# Patient Record
Sex: Female | Born: 1970 | Race: White | Hispanic: No | Smoking: Never smoker
Health system: Southern US, Community
[De-identification: ages and names within clinical notes are randomized; demographics above are authoritative.]

## PROBLEM LIST (undated history)

## (undated) DIAGNOSIS — E288 Other ovarian dysfunction: Secondary | ICD-10-CM

## (undated) DIAGNOSIS — R87619 Unspecified abnormal cytological findings in specimens from cervix uteri: Secondary | ICD-10-CM

## (undated) DIAGNOSIS — F419 Anxiety disorder, unspecified: Secondary | ICD-10-CM

## (undated) DIAGNOSIS — E2839 Other primary ovarian failure: Secondary | ICD-10-CM

## (undated) DIAGNOSIS — B009 Herpesviral infection, unspecified: Secondary | ICD-10-CM

## (undated) DIAGNOSIS — M8580 Other specified disorders of bone density and structure, unspecified site: Secondary | ICD-10-CM

## (undated) HISTORY — DX: Other ovarian dysfunction: E28.8

## (undated) HISTORY — DX: Other primary ovarian failure: E28.39

## (undated) HISTORY — DX: Other specified disorders of bone density and structure, unspecified site: M85.80

## (undated) HISTORY — DX: Anxiety disorder, unspecified: F41.9

## (undated) HISTORY — DX: Unspecified abnormal cytological findings in specimens from cervix uteri: R87.619

## (undated) HISTORY — DX: Herpesviral infection, unspecified: B00.9

---

## 1991-06-04 HISTORY — PX: WISDOM TOOTH EXTRACTION: SHX21

## 1993-06-03 DIAGNOSIS — R87619 Unspecified abnormal cytological findings in specimens from cervix uteri: Secondary | ICD-10-CM

## 1993-06-03 HISTORY — DX: Unspecified abnormal cytological findings in specimens from cervix uteri: R87.619

## 1994-03-03 HISTORY — PX: GYNECOLOGIC CRYOSURGERY: SHX857

## 1998-09-21 ENCOUNTER — Other Ambulatory Visit: Admission: RE | Admit: 1998-09-21 | Discharge: 1998-09-21 | Payer: Self-pay | Admitting: Obstetrics and Gynecology

## 1999-01-24 ENCOUNTER — Other Ambulatory Visit: Admission: RE | Admit: 1999-01-24 | Discharge: 1999-01-24 | Payer: Self-pay | Admitting: Obstetrics and Gynecology

## 1999-10-03 ENCOUNTER — Other Ambulatory Visit: Admission: RE | Admit: 1999-10-03 | Discharge: 1999-10-03 | Payer: Self-pay | Admitting: Obstetrics and Gynecology

## 2000-10-02 ENCOUNTER — Other Ambulatory Visit: Admission: RE | Admit: 2000-10-02 | Discharge: 2000-10-02 | Payer: Self-pay | Admitting: Obstetrics and Gynecology

## 2001-10-09 ENCOUNTER — Other Ambulatory Visit: Admission: RE | Admit: 2001-10-09 | Discharge: 2001-10-09 | Payer: Self-pay | Admitting: Obstetrics and Gynecology

## 2002-11-12 ENCOUNTER — Other Ambulatory Visit: Admission: RE | Admit: 2002-11-12 | Discharge: 2002-11-12 | Payer: Self-pay | Admitting: Obstetrics and Gynecology

## 2004-01-06 ENCOUNTER — Other Ambulatory Visit: Admission: RE | Admit: 2004-01-06 | Discharge: 2004-01-06 | Payer: Self-pay | Admitting: Obstetrics and Gynecology

## 2005-02-14 ENCOUNTER — Other Ambulatory Visit: Admission: RE | Admit: 2005-02-14 | Discharge: 2005-02-14 | Payer: Self-pay | Admitting: Obstetrics and Gynecology

## 2006-02-28 ENCOUNTER — Other Ambulatory Visit: Admission: RE | Admit: 2006-02-28 | Discharge: 2006-02-28 | Payer: Self-pay | Admitting: Obstetrics & Gynecology

## 2007-03-03 ENCOUNTER — Other Ambulatory Visit: Admission: RE | Admit: 2007-03-03 | Discharge: 2007-03-03 | Payer: Self-pay | Admitting: Obstetrics and Gynecology

## 2008-02-26 ENCOUNTER — Other Ambulatory Visit: Admission: RE | Admit: 2008-02-26 | Discharge: 2008-02-26 | Payer: Self-pay | Admitting: Obstetrics and Gynecology

## 2010-04-16 LAB — HM PAP SMEAR: HM PAP: NEGATIVE

## 2010-05-21 ENCOUNTER — Encounter
Admission: RE | Admit: 2010-05-21 | Discharge: 2010-05-21 | Payer: Self-pay | Source: Home / Self Care | Attending: Obstetrics & Gynecology | Admitting: Obstetrics & Gynecology

## 2015-06-26 ENCOUNTER — Encounter: Payer: Self-pay | Admitting: Nurse Practitioner

## 2015-06-26 ENCOUNTER — Ambulatory Visit (INDEPENDENT_AMBULATORY_CARE_PROVIDER_SITE_OTHER): Payer: BLUE CROSS/BLUE SHIELD | Admitting: Nurse Practitioner

## 2015-06-26 ENCOUNTER — Encounter: Payer: Self-pay | Admitting: *Deleted

## 2015-06-26 VITALS — BP 136/80 | HR 84 | Ht 59.5 in | Wt 99.0 lb

## 2015-06-26 DIAGNOSIS — Z01419 Encounter for gynecological examination (general) (routine) without abnormal findings: Secondary | ICD-10-CM | POA: Diagnosis not present

## 2015-06-26 DIAGNOSIS — E2839 Other primary ovarian failure: Secondary | ICD-10-CM

## 2015-06-26 DIAGNOSIS — M8580 Other specified disorders of bone density and structure, unspecified site: Secondary | ICD-10-CM | POA: Diagnosis not present

## 2015-06-26 DIAGNOSIS — Z0000 Encounter for general adult medical examination without abnormal findings: Secondary | ICD-10-CM | POA: Diagnosis not present

## 2015-06-26 DIAGNOSIS — E288 Other ovarian dysfunction: Secondary | ICD-10-CM

## 2015-06-26 NOTE — Patient Instructions (Addendum)

## 2015-06-26 NOTE — Progress Notes (Signed)
Patient ID: Andrea Newman, female   DOB: 1971-05-26, 45 y.o.   MRN: [id number]  45 y.o. G0P0 Significant other Caucasian Fe here for NGYN annual exam.  She is a former pt with lapse of care secondary to insurance.  She has history of POF at age 89- 32.  Last FSH was 150  on 04/16/2010.  She was place on Loestrin and did well until going off late 2013.  Same partner monogamous for about 17 yrs.  She is having no vaso symptoms.  She has had an increase in anxiety since her mothers death in March 15, 2023.  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  She is now back out of school to care for her mother and has not been able to go back to school since then.   She was pursuing a business degree.  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  She declines any labs today.  Patient's last menstrual period was 04/15/2011 (exact date).          Sexually active: Yes.    The current method of family planning is premature ovarian failure.    Exercising: Yes.    ocassional walking Smoker:  no  Health Maintenance: Pap:  04/16/10, Negative  Prior abnormal pap CIN I 1995 MMG:  Never BMD:  05/21/2010: T score spine-1.6; left femur neck -1.8 TDaP:  2011 HIV:  Not done  Labs: not done   reports that she has never smoked. She has never used smokeless tobacco. She reports that she drinks alcohol. She reports that she does not use illicit drugs.  Past Medical History  Diagnosis Date  . 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 HSV-1 infection   . Premature ovarian failure   . Osteopenia   . Anxiety     Past Surgical History  Procedure Laterality Date  . Wisdom tooth extraction  1993  . Gynecologic cryosurgery  03/1994    CIN-I    Current Outpatient Prescriptions  Medication Sig Dispense Refill  . CALCIUM PO Take 120 mg by mouth daily.    . Multiple Vitamin  (MULTIVITAMIN) tablet Take 1 tablet by mouth daily.    . vitamin C (ASCORBIC ACID) 500 MG tablet Take 500 mg by mouth daily.     No current facility-administered medications for this visit.    Family History  Problem Relation Age of Onset  . Ovarian cancer Maternal Grandmother   . Cancer Mother     skin  . Heart failure Mother   . Hypertension Mother   . Diabetes Maternal Grandfather   . Stroke Maternal Grandfather     ROS:  Pertinent items are noted in HPI.  Otherwise, a comprehensive ROS was negative.  Exam:   BP 136/80 mmHg  Pulse 84  Ht 4' 11.5" (1.511 m)  Wt 99 lb (44.906 kg)  BMI 19.67 kg/m2  LMP 04/15/2011 (Exact Date) Height: 4' 11.5" (151.1 cm) Ht Readings from Last 3 Encounters:  06/26/15 4' 11.5" (1.511 m)    General appearance: alert, cooperative and appears stated age Head: Normocephalic, without obvious abnormality, atraumatic Neck: no adenopathy, supple, symmetrical, trachea midline and thyroid normal to inspection and palpation Lungs: clear to auscultation bilaterally Breasts: normal appearance, no masses or tenderness Heart: regular rate and rhythm Abdomen: soft, non-tender;  no masses,  no organomegaly Extremities: extremities normal, atraumatic, no cyanosis or edema Skin: Skin color, texture, turgor normal. No rashes or lesions Lymph nodes: Cervical, supraclavicular, and axillary nodes normal. No abnormal inguinal nodes palpated Neurologic: Grossly normal   Pelvic: External genitalia:  no lesions              Urethra:  normal appearing urethra with no masses, tenderness or lesions              Bartholin's and Skene's: normal                 Vagina: normal appearing vagina with normal color and discharge, no lesions              Cervix: anteverted              Pap taken: Yes.   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 Hyacinth Meeker as to plan for OCP/ HRT ?  Counseled on breast self exam, mammography screening, adequate intake of calcium and vitamin D, diet and exercise return annually or prn  An After Visit Summary was printed and given to the patient.

## 2015-06-28 LAB — IPS PAP TEST WITH HPV

## 2015-06-30 NOTE — Progress Notes (Signed)
Feel pt should start HRT at this time.  Would initially recommend Vivelle dot 0.5mg  patches twice weekly and Prometrium  daily.  Reviewed personally.  Lum Keas, MD.

## 2015-07-07 ENCOUNTER — Telehealth: Payer: Self-pay

## 2015-07-07 NOTE — Telephone Encounter (Signed)
Spoke with patient. Advised of recommendations from Dr.Miller as seen below. She is requesting to be seen for a consultation appointment with Dr.Miller prior to stating on HRT. Appointment scheduled for 07/14/2015 at 11:15 am with Dr.Miller. She is agreeable to date and time.  Cc: Ria Comment, FNP   Routing to provider for final review. Patient agreeable to disposition. Will close encounter.

## 2015-07-07 NOTE — Telephone Encounter (Addendum)
-----   Message from Ria Comment, FNP sent at 07/02/2015  8:24 PM EST ----- Patient is aware of consultation with Dr. Hyacinth Meeker.  Here are her recommendations. ----- Message -----    From: Jerene Bears, MD    Sent: 06/30/2015   5:16 PM      To: Ria Comment, FNP  Jerene Bears, MD at 06/30/2015 5:15 PM     Status: Signed       Expand All Collapse All   Feel pt should start HRT at this time. Would initially recommend Vivelle dot 0.5mg  patches twice weekly and Prometrium  daily. Reviewed personally. Lum Keas, MD.

## 2015-07-14 ENCOUNTER — Encounter: Payer: Self-pay | Admitting: Obstetrics & Gynecology

## 2015-07-14 ENCOUNTER — Ambulatory Visit (INDEPENDENT_AMBULATORY_CARE_PROVIDER_SITE_OTHER): Payer: BLUE CROSS/BLUE SHIELD | Admitting: Obstetrics & Gynecology

## 2015-07-14 VITALS — BP 158/86 | HR 84 | Resp 16 | Ht 59.5 in | Wt 101.0 lb

## 2015-07-14 DIAGNOSIS — E2839 Other primary ovarian failure: Secondary | ICD-10-CM

## 2015-07-14 DIAGNOSIS — Z1239 Encounter for other screening for malignant neoplasm of breast: Secondary | ICD-10-CM | POA: Diagnosis not present

## 2015-07-14 DIAGNOSIS — E288 Other ovarian dysfunction: Secondary | ICD-10-CM | POA: Diagnosis not present

## 2015-07-14 DIAGNOSIS — M8580 Other specified disorders of bone density and structure, unspecified site: Secondary | ICD-10-CM

## 2015-07-14 NOTE — Progress Notes (Signed)
Scheduled patient while [facility] on 08/04/2015 at 3:30 pm. She is agreeable to date and time.

## 2015-07-14 NOTE — Progress Notes (Signed)
Patient ID: Andrea Newman, female   DOB: 1971/02/06, 45 y.o.   MRN: [id number]  45 yo G0 WF here for discussion of HRT use.  Pt with hx of POF around age 36.  She was on Loestrin OCPs until 2013.  Pt reports she had job change and was without insurance and it was just too costly for her to pay for the pill.  She was seen 06/26/15 for AEX with [facility].  This was discussed briefly but pt is here for a more in-depth discussion and to answer questions she may have regarding this.  Due to her young age and body habitus ( 66ft 11.5 in, wt 99#) as well as hx  of osteopenia noted on BMD 05/21/10 with T score -1.6 in spine and -1.8 in femur, I feel she should consider HRT for long term bone health and cardiovascular benefit.  Pt is anxious about risks.  These were discussed including but not limited to increased risks of increased risks of MI primarily within first two years of use, stroke, DVT, and breast cancer.  Increased risks of gall bladder disease and change in cholesterol panels also discussed.  Possibility of bleeding discussed however this is doubtful of this with hx of amenorrhea with her OCPs.  Pt is not symptomatic of menopause now with basically no hot flashes or sleep issues.  She does have some mild vaginal dryness.  Pt is aware this will improve.  Also, pt is aware that the typical woman, her age, would have endogenous estrogen so her risks of negative side effects would be less than a woman who is above the age of 68.  We also reviewed benefits including improved bone density, improved CV health long term and decreased risks of colon cancer.     Past Medical History  Diagnosis Date  . 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 HSV-1 infection   . Premature ovarian failure   . Osteopenia   . Anxiety    Heart disease:  No. DVT history:  No. Breast Cancer:  No.  Last Mammogram: never had one.  Pt aware she needs to start this. Last AEX:   06/26/15 Last Pap smear:  06/26/15  The pt does not have a strong family hx of CVD or stroke.  She denies family hx of DVT.    Assessment:  H/O premature ovarian failure H/O osteopenia with risks for worsening BMD  Plan:  HRT options discussed.  Pt is still not decided about starting.  She and I decide to proceed with MMG and BMD and see where she is in regards to her BMD.  If this is stable, she will not start HRT (as she is really not symptomatic) and will repeat the BMD in 2 year.  Adequate calcium and Vit D discussed.  Recheck will depend on whether pt starts HRT.     ~15 minutes spent with patient >50% of time was in face to face discussion of above.

## 2015-08-04 ENCOUNTER — Other Ambulatory Visit: Payer: Self-pay

## 2015-08-04 ENCOUNTER — Ambulatory Visit
Admission: RE | Admit: 2015-08-04 | Discharge: 2015-08-04 | Disposition: A | Discharge status: Home / Self Care | Payer: BLUE CROSS/BLUE SHIELD | Source: Ambulatory Visit | Attending: Obstetrics & Gynecology | Admitting: Obstetrics & Gynecology

## 2015-08-04 DIAGNOSIS — Z1239 Encounter for other screening for malignant neoplasm of breast: Secondary | ICD-10-CM

## 2015-08-16 ENCOUNTER — Ambulatory Visit
Admission: RE | Admit: 2015-08-16 | Discharge: 2015-08-16 | Disposition: A | Discharge status: Home / Self Care | Payer: BLUE CROSS/BLUE SHIELD | Source: Ambulatory Visit | Attending: Obstetrics & Gynecology | Admitting: Obstetrics & Gynecology

## 2015-08-16 DIAGNOSIS — M8580 Other specified disorders of bone density and structure, unspecified site: Secondary | ICD-10-CM

## 2015-08-16 DIAGNOSIS — E288 Other ovarian dysfunction: Secondary | ICD-10-CM

## 2015-08-16 DIAGNOSIS — E2839 Other primary ovarian failure: Secondary | ICD-10-CM

## 2015-08-22 ENCOUNTER — Other Ambulatory Visit: Payer: Self-pay

## 2015-08-24 ENCOUNTER — Telehealth: Payer: Self-pay | Admitting: *Deleted

## 2015-08-24 NOTE — Telephone Encounter (Signed)
Notes Recorded by Dion Bodyeina C Beltran, CMA on 08/24/2015 at 2:06 PM LM for pt to call back. Notes Recorded by Jerene BearsMary S Miller, MD on 08/23/2015 at 7:40 AM Please let Mrs. Andrea Newman know that she does have some mild osteopenia in her spine and moderate osteopenia in her hips. She had premature ovarian failure and hx of osteopenia in BMD in 2011. However, there isn't much change in her BMD. We discussed her starting HRT at her last visit but she was hesitant. With this test, I think it is ok to continue to follow conservatively and not start HRT. I would repeat her BMD in 3-4 years, again. If she has a lot of questions, have her come back and she and I can discuss further.

## 2015-08-24 NOTE — Telephone Encounter (Signed)
Patient returned call

## 2015-08-25 NOTE — Telephone Encounter (Signed)
Spoke with patient. Advised of results as seen below from Dr.Miller. She is agreeable and verbalizes understanding.  Routing to provider for final review. Patient agreeable to disposition. Will close encounter.  

## 2016-10-17 ENCOUNTER — Other Ambulatory Visit: Payer: Self-pay | Admitting: Physical Medicine and Rehabilitation

## 2016-10-17 DIAGNOSIS — Z1231 Encounter for screening mammogram for malignant neoplasm of breast: Secondary | ICD-10-CM

## 2016-11-01 ENCOUNTER — Ambulatory Visit
Admission: RE | Admit: 2016-11-01 | Discharge: 2016-11-01 | Disposition: A | Discharge status: Home / Self Care | Payer: BLUE CROSS/BLUE SHIELD | Source: Ambulatory Visit | Attending: Physical Medicine and Rehabilitation | Admitting: Physical Medicine and Rehabilitation

## 2016-11-01 DIAGNOSIS — Z1231 Encounter for screening mammogram for malignant neoplasm of breast: Secondary | ICD-10-CM

## 2017-12-29 ENCOUNTER — Other Ambulatory Visit: Payer: Self-pay | Admitting: Obstetrics & Gynecology

## 2017-12-29 DIAGNOSIS — Z1231 Encounter for screening mammogram for malignant neoplasm of breast: Secondary | ICD-10-CM

## 2018-01-19 ENCOUNTER — Ambulatory Visit
Admission: RE | Admit: 2018-01-19 | Discharge: 2018-01-19 | Disposition: A | Discharge status: Home / Self Care | Payer: BLUE CROSS/BLUE SHIELD | Source: Ambulatory Visit | Attending: Obstetrics & Gynecology | Admitting: Obstetrics & Gynecology

## 2018-01-19 DIAGNOSIS — Z1231 Encounter for screening mammogram for malignant neoplasm of breast: Secondary | ICD-10-CM

## 2019-06-09 ENCOUNTER — Ambulatory Visit: Payer: 59 | Attending: Internal Medicine

## 2019-06-09 DIAGNOSIS — Z20822 Contact with and (suspected) exposure to covid-19: Secondary | ICD-10-CM

## 2019-06-11 LAB — NOVEL CORONAVIRUS, NAA: SARS-CoV-2, NAA: NOT DETECTED

## 2019-09-03 ENCOUNTER — Ambulatory Visit: Payer: 59 | Attending: Internal Medicine

## 2019-09-03 DIAGNOSIS — Z23 Encounter for immunization: Secondary | ICD-10-CM

## 2019-09-03 NOTE — Progress Notes (Signed)
   Covid-19 Vaccination Clinic  Name:  Andrea Newman    MRN: [id number] DOB: Dec 08, 1970  09/03/2019  [name] was observed post Covid-19 immunization for 15 minutes without incident. She was provided with Vaccine Information Sheet and instruction to access the V-Safe system.   [name] was instructed to call 911 with any severe reactions post vaccine: Marland Kitchen Difficulty breathing  . Swelling of face and throat  . A fast heartbeat  . A bad rash all over body  . Dizziness and weakness   Immunizations Administered    Name Date Dose VIS Date Route   Pfizer COVID-19 Vaccine 09/03/2019 11:21 AM 0.3 mL 05/14/2019 Intramuscular   Manufacturer: [employer], [employer]   Lot: [id number]   NDC: [address]-7

## 2019-09-27 ENCOUNTER — Ambulatory Visit: Payer: 59 | Attending: Internal Medicine

## 2019-09-27 DIAGNOSIS — Z23 Encounter for immunization: Secondary | ICD-10-CM

## 2019-09-27 NOTE — Progress Notes (Signed)
   Covid-19 Vaccination Clinic  Name:  Andrea Newman    MRN: [id number] DOB: July 07, 1970  09/27/2019  [name] was observed post Covid-19 immunization for 15 minutes without incident. She was provided with Vaccine Information Sheet and instruction to access the V-Safe system.   [name] was instructed to call 911 with any severe reactions post vaccine: Marland Kitchen Difficulty breathing  . Swelling of face and throat  . A fast heartbeat  . A bad rash all over body  . Dizziness and weakness   Immunizations Administered    Name Date Dose VIS Date Route   Pfizer COVID-19 Vaccine 09/27/2019  1:09 PM 0.3 mL 07/28/2018 Intramuscular   Manufacturer: [employer], [employer]   Lot: [id number]   NDC: [address]-4

## 2019-12-30 ENCOUNTER — Other Ambulatory Visit: Payer: Self-pay

## 2019-12-30 ENCOUNTER — Ambulatory Visit
Admission: RE | Admit: 2019-12-30 | Discharge: 2019-12-30 | Disposition: A | Discharge status: Home / Self Care | Payer: 59 | Source: Ambulatory Visit | Attending: Obstetrics & Gynecology | Admitting: Obstetrics & Gynecology

## 2019-12-30 ENCOUNTER — Other Ambulatory Visit: Payer: Self-pay | Admitting: Obstetrics & Gynecology

## 2019-12-30 DIAGNOSIS — Z1231 Encounter for screening mammogram for malignant neoplasm of breast: Secondary | ICD-10-CM

## 2021-05-31 IMAGING — MG DIGITAL SCREENING BILAT W/ CAD
5 series · 5 of 5 positions shown · non-contrast
Comparison: Previous exam(s).

CLINICAL DATA: Screening.

EXAM:
DIGITAL SCREENING BILATERAL MAMMOGRAM WITH CAD

[L MLO (1 of 2)]
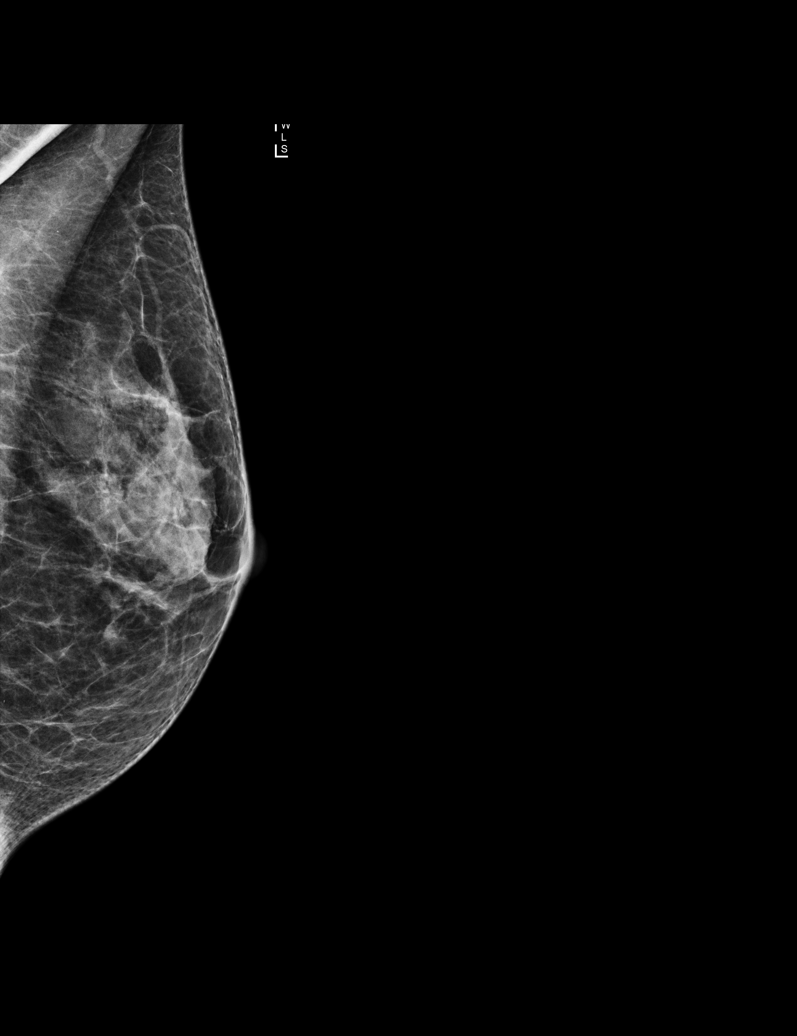

[L MLO (2 of 2)]
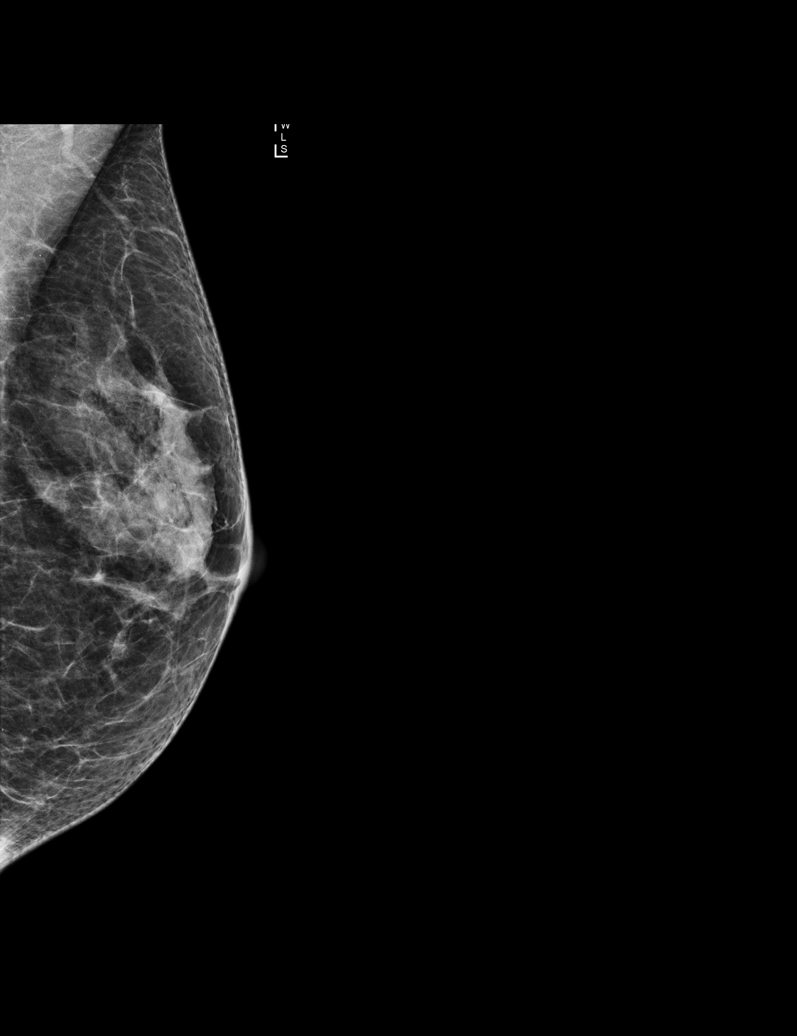

[L CC]
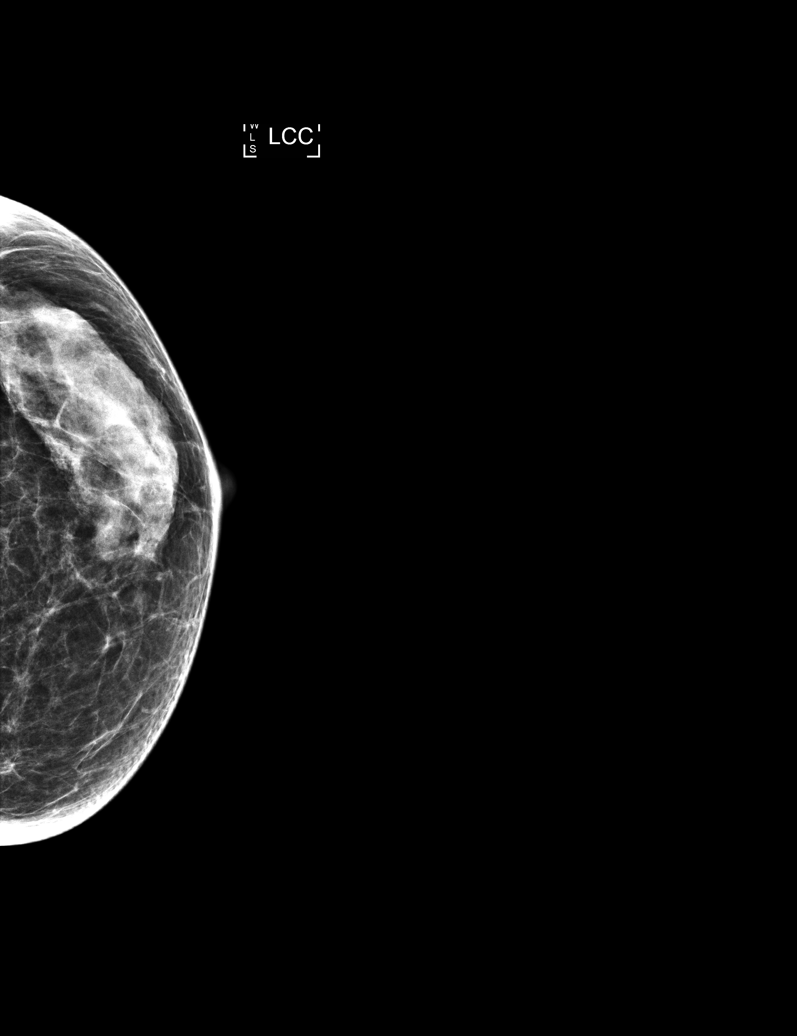

[R CC]
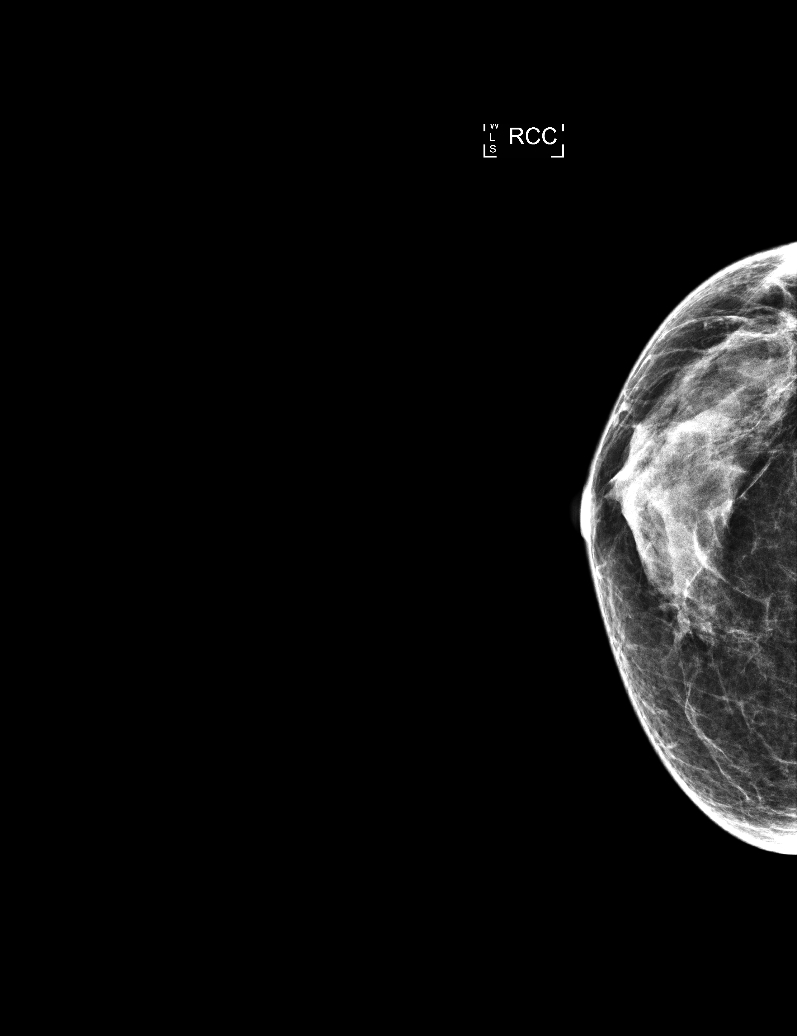

[R MLO]
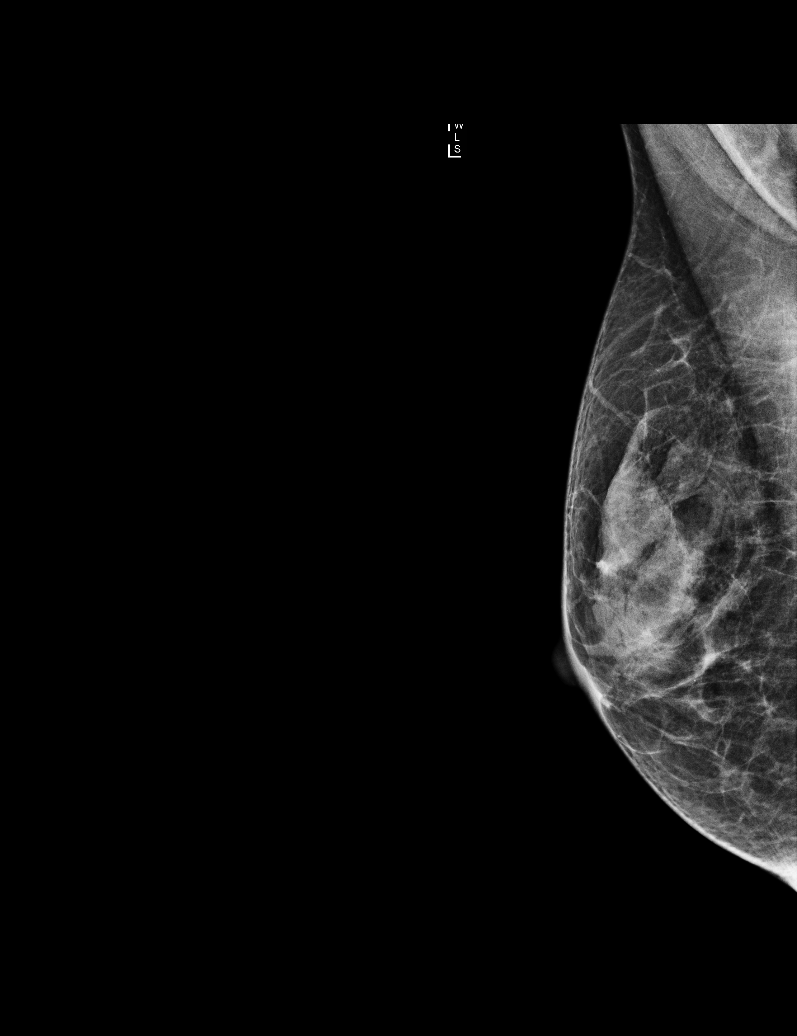

[5 of 5 positions shown; findings below may reference images not displayed]

ACR Breast Density Category c: The breast tissue is heterogeneously
dense, which may obscure small masses.
FINDINGS: There are no findings suspicious for malignancy. Images were
processed with CAD.
IMPRESSION: No mammographic evidence of malignancy. A result letter of this
screening mammogram will be mailed directly to the patient.

RECOMMENDATION:
Screening mammogram in one year. (Code:[id number])

BI-RADS CATEGORY  1: Negative.

## 2022-01-02 ENCOUNTER — Other Ambulatory Visit: Payer: Self-pay | Admitting: Obstetrics & Gynecology

## 2022-01-02 DIAGNOSIS — Z1231 Encounter for screening mammogram for malignant neoplasm of breast: Secondary | ICD-10-CM

## 2022-01-16 ENCOUNTER — Ambulatory Visit
Admission: RE | Admit: 2022-01-16 | Discharge: 2022-01-16 | Disposition: A | Discharge status: Home / Self Care | Payer: Commercial Managed Care - HMO | Source: Ambulatory Visit | Attending: Obstetrics & Gynecology | Admitting: Obstetrics & Gynecology

## 2022-01-16 DIAGNOSIS — Z1231 Encounter for screening mammogram for malignant neoplasm of breast: Secondary | ICD-10-CM

## 2022-04-22 ENCOUNTER — Encounter (HOSPITAL_BASED_OUTPATIENT_CLINIC_OR_DEPARTMENT_OTHER): Payer: Self-pay | Admitting: Obstetrics & Gynecology

## 2022-04-22 ENCOUNTER — Ambulatory Visit (INDEPENDENT_AMBULATORY_CARE_PROVIDER_SITE_OTHER): Payer: Commercial Managed Care - HMO | Admitting: Obstetrics & Gynecology

## 2022-04-22 ENCOUNTER — Other Ambulatory Visit (HOSPITAL_COMMUNITY)
Admission: RE | Admit: 2022-04-22 | Discharge: 2022-04-22 | Disposition: A | Discharge status: Home / Self Care | Payer: Commercial Managed Care - HMO | Source: Ambulatory Visit | Attending: Obstetrics & Gynecology | Admitting: Obstetrics & Gynecology

## 2022-04-22 VITALS — BP 172/88 | HR 90 | Ht 59.5 in | Wt 93.8 lb

## 2022-04-22 DIAGNOSIS — E2839 Other primary ovarian failure: Secondary | ICD-10-CM

## 2022-04-22 DIAGNOSIS — Z124 Encounter for screening for malignant neoplasm of cervix: Secondary | ICD-10-CM | POA: Insufficient documentation

## 2022-04-22 DIAGNOSIS — Z01419 Encounter for gynecological examination (general) (routine) without abnormal findings: Secondary | ICD-10-CM

## 2022-04-22 DIAGNOSIS — Z23 Encounter for immunization: Secondary | ICD-10-CM

## 2022-04-22 DIAGNOSIS — R030 Elevated blood-pressure reading, without diagnosis of hypertension: Secondary | ICD-10-CM | POA: Diagnosis not present

## 2022-04-22 DIAGNOSIS — M8580 Other specified disorders of bone density and structure, unspecified site: Secondary | ICD-10-CM

## 2022-04-22 NOTE — Patient Instructions (Signed)
Replens vaginal moisturizer-- OTC [employer] product that Vit E and hyaluronic acid -- Gynatrof Coconut oil Vaginal estrogen cream --prescription, twice weekly

## 2022-04-22 NOTE — Progress Notes (Signed)
51 y.o. G0P0000 Significant Other White or Caucasian female here for annual exam.  Former patient.  Last seen in 2017.  She is in school studying business administration.  Has three weeks left to the semester.  Lots of stressors right now.  Has one semester and two extra classes until she is done.    Denies vaginal bleeding.  H/o premature ovarian failure. Has not been on estrogen for several years.  Does have some vaginal dryness.    Engaged and together 23 years.    Patient's last menstrual period was 04/15/2011 (exact date).          Sexually active: Yes.    The current method of family planning is post menopausal status.    Exercising: No.   walking Smoker:  no  Health Maintenance: Pap:  06/26/2015 Negative History of abnormal Pap:  1995 CIN 1 MMG:  01/16/2022 Negative Colonoscopy:  never.  Dr. Tracie Harrier gave guaiac test BMD:   08/16/2015 Osteopenia Screening Labs: with Dr. Tracie Harrier   reports that she has never smoked. She has never used smokeless tobacco. She reports current alcohol use. She reports that she does not use drugs.  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     Multiple Vitamin (MULTIVITAMIN) tablet Take 1 tablet by mouth daily.     nebivolol (BYSTOLIC) 10 MG tablet Take 10 mg by mouth daily.     Risankizumab-rzaa (SKYRIZI) 150 MG/ML SOSY Inject into the skin.     vitamin C (ASCORBIC ACID) 500 MG tablet Take 500 mg by mouth daily.     No current facility-administered medications for this visit.    Family History  Problem Relation Age of Onset   Ovarian cancer Maternal  Grandmother    Cancer Mother        skin   Heart failure Mother    Hypertension Mother    Diabetes Maternal Grandfather    Stroke Maternal Grandfather     ROS: Constitutional: negative Genitourinary:negative  Exam:   BP (!) 161/98 (BP Location: Left Arm, Patient Position: Sitting, Cuff Size: Normal)   Pulse 90   Ht 4' 11.5" (1.511 m) Comment: Reported  Wt 93 lb 12.8 oz (42.5 kg)   LMP 04/15/2011 (Exact Date)   BMI 18.63 kg/m   Height: 4' 11.5" (151.1 cm) (Reported)  General appearance: alert, cooperative and appears stated age Head: Normocephalic, without obvious abnormality, atraumatic Neck: no adenopathy, supple, symmetrical, trachea midline and thyroid normal to inspection and palpation Lungs: clear to auscultation bilaterally Breasts: normal appearance, no masses or tenderness Heart: regular rate and rhythm Abdomen: soft, non-tender; bowel sounds normal; no masses,  no organomegaly Extremities: extremities normal, atraumatic, no cyanosis or edema Skin: Skin color, texture, turgor normal. No rashes or lesions Lymph nodes: Cervical, supraclavicular, and axillary nodes normal. No abnormal inguinal nodes palpated Neurologic: Grossly normal   Pelvic: External genitalia:  no lesions              Urethra:  normal appearing urethra with no masses, tenderness  or lesions              Bartholins and Skenes: normal                 Vagina: normal appearing vagina with normal color and no discharge, no lesions              Cervix: no lesions              Pap taken: Yes.   Bimanual Exam:  Uterus:  normal size, contour, position, consistency, mobility, non-tender              Adnexa: normal adnexa and no mass, fullness, tenderness               Rectovaginal: Confirms               Anus:  normal sphincter tone, no lesions  Chaperone, Ina Homes, CMA, was present for exam.  Assessment/Plan: 1. Well woman exam with routine gynecological exam - Pap smear obtained today -  Mammogram up to date - Colonoscopy declined but stool testing for blood given by PCP - Bone mineral density will be planned - lab work done with PCP, Dr. Tracie Harrier - vaccines reviewed/updated  2. Elevated blood pressure reading - pt is going to start monitoring this at home  3. Cervical cancer screening - Cytology - PAP( [facility])  4. Influenza vaccination administered at current visit - Flu Vaccine QUAD 36+ mos IM (Fluarix, Quad PF)  5. Premature ovarian failure

## 2022-04-23 LAB — CYTOLOGY - PAP
Comment: NEGATIVE
Diagnosis: NEGATIVE
High risk HPV: NEGATIVE

## 2022-10-07 ENCOUNTER — Encounter: Payer: Self-pay | Admitting: Podiatry

## 2022-10-07 ENCOUNTER — Ambulatory Visit (INDEPENDENT_AMBULATORY_CARE_PROVIDER_SITE_OTHER): Payer: Commercial Managed Care - HMO

## 2022-10-07 ENCOUNTER — Ambulatory Visit: Payer: Commercial Managed Care - HMO | Admitting: Podiatry

## 2022-10-07 DIAGNOSIS — M71572 Other bursitis, not elsewhere classified, left ankle and foot: Secondary | ICD-10-CM

## 2022-10-07 DIAGNOSIS — R52 Pain, unspecified: Secondary | ICD-10-CM

## 2022-10-07 DIAGNOSIS — M21619 Bunion of unspecified foot: Secondary | ICD-10-CM | POA: Diagnosis not present

## 2022-10-07 DIAGNOSIS — M7752 Other enthesopathy of left foot: Secondary | ICD-10-CM

## 2022-10-07 MED ORDER — TRIAMCINOLONE ACETONIDE 10 MG/ML IJ SUSP
10.0000 mg | Freq: Once | INTRAMUSCULAR | Status: AC
  Administered 2022-10-07: 10 mg

## 2022-10-09 NOTE — Progress Notes (Signed)
Subjective:   Patient ID: Andrea Newman, female   DOB: 52 y.o.   MRN: [id number]   HPI Patient presents with pain in the left foot that is been present for several months.  Does not remember specific injury did break the fifth metatarsal bone last year in October and has been using metatarsal pads to try to offload which have not been successful.  Patient likes to be active does not smoke   Review of Systems  All other systems reviewed and are negative.       Objective:  Physical Exam Vitals and nursing note reviewed.  Constitutional:      Appearance: She is well-developed.  Pulmonary:     Effort: Pulmonary effort is normal.  Musculoskeletal:        General: Normal range of motion.  Skin:    General: Skin is warm.  Neurological:     Mental Status: She is alert.     Neurovascular status found to be intact muscle strength found to be adequate range of motion was found to be adequate.  Patient guards her arthrogram her in her foot where x-rays and has quite a bit of discomfort mostly centered around the second metatarsal phalangeal joint with fluid in the joint.  Good digital perfusion well-oriented     Assessment:  Possibility for inflammatory type capsular condition secondary to offloading from the fracture of the fifth metatarsal creating fluid buildup pain with moderate bunion also noted     Plan:  H&P reviewed all conditions.  I will get a try to focus on the second joint and I did do a proximal nerve block I aspirated the joint I was able to get out some clear fluid I injected quarter cc dexamethasone Kenalog applied thick plantar padding to reduce pressure on the joint surface and rigid bottom shoes  X-rays indicate there is osteoporosis or osteopenia present left and she is going to have her bone density repeated.  Moderate bunion no signs of arthritis or stress fracture around the second metatarsal

## 2022-10-30 ENCOUNTER — Ambulatory Visit: Payer: Commercial Managed Care - HMO | Admitting: Podiatry

## 2022-10-31 ENCOUNTER — Encounter: Payer: Self-pay | Admitting: Podiatry

## 2022-10-31 ENCOUNTER — Ambulatory Visit (INDEPENDENT_AMBULATORY_CARE_PROVIDER_SITE_OTHER): Payer: Commercial Managed Care - HMO | Admitting: Podiatry

## 2022-10-31 DIAGNOSIS — M7752 Other enthesopathy of left foot: Secondary | ICD-10-CM | POA: Diagnosis not present

## 2022-10-31 DIAGNOSIS — M216X2 Other acquired deformities of left foot: Secondary | ICD-10-CM

## 2022-10-31 DIAGNOSIS — M7751 Other enthesopathy of right foot: Secondary | ICD-10-CM

## 2022-10-31 DIAGNOSIS — M216X1 Other acquired deformities of right foot: Secondary | ICD-10-CM

## 2022-11-01 NOTE — Progress Notes (Signed)
Subjective:   Patient ID: Andrea Newman, female   DOB: 52 y.o.   MRN: [id number]   HPI Patient states she is improved still has mild discomfort forefoot left but has shown some improvement with medication and padding.  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  Patient will be seen back when returned and will use metatarsal pads until that time and rigid bottom shoes

## 2022-11-28 ENCOUNTER — Ambulatory Visit (INDEPENDENT_AMBULATORY_CARE_PROVIDER_SITE_OTHER): Payer: Commercial Managed Care - HMO | Admitting: Podiatry

## 2022-11-28 DIAGNOSIS — M7752 Other enthesopathy of left foot: Secondary | ICD-10-CM

## 2022-11-28 DIAGNOSIS — R52 Pain, unspecified: Secondary | ICD-10-CM

## 2022-11-28 DIAGNOSIS — M21619 Bunion of unspecified foot: Secondary | ICD-10-CM

## 2022-11-28 NOTE — Progress Notes (Signed)
Patient presents today to pick up custom orthotics   Patient was dispensed 1 pair of custom orthotics  Fit was satisfactory. Instructions for break-in and wear was reviewed and a copy was given to the patient.    

## 2023-03-24 ENCOUNTER — Other Ambulatory Visit: Payer: Self-pay | Admitting: Family Medicine

## 2023-03-24 DIAGNOSIS — Z1231 Encounter for screening mammogram for malignant neoplasm of breast: Secondary | ICD-10-CM

## 2023-04-18 ENCOUNTER — Ambulatory Visit
Admission: RE | Admit: 2023-04-18 | Discharge: 2023-04-18 | Disposition: A | Discharge status: Home / Self Care | Payer: Managed Care, Other (non HMO) | Source: Ambulatory Visit | Attending: Family Medicine

## 2023-04-18 DIAGNOSIS — Z1231 Encounter for screening mammogram for malignant neoplasm of breast: Secondary | ICD-10-CM

## 2024-02-27 ENCOUNTER — Other Ambulatory Visit: Payer: Self-pay | Admitting: Family Medicine

## 2024-02-27 DIAGNOSIS — Z1231 Encounter for screening mammogram for malignant neoplasm of breast: Secondary | ICD-10-CM

## 2024-04-19 ENCOUNTER — Ambulatory Visit
Admission: RE | Admit: 2024-04-19 | Discharge: 2024-04-19 | Disposition: A | Discharge status: Home / Self Care | Source: Ambulatory Visit | Attending: Family Medicine | Admitting: Family Medicine

## 2024-04-19 DIAGNOSIS — Z1231 Encounter for screening mammogram for malignant neoplasm of breast: Secondary | ICD-10-CM
